# Patient Record
Sex: Male | Born: 1990 | Race: Black or African American | Hispanic: No | Marital: Single | Smoking: Never smoker
Health system: Southern US, Community
[De-identification: ages and names within clinical notes are randomized; demographics above are authoritative.]

---

## 2014-03-24 LAB — HM HIV SCREENING LAB: HM HIV Screening: NEGATIVE

## 2017-12-03 ENCOUNTER — Encounter: Payer: Self-pay | Admitting: Emergency Medicine

## 2017-12-03 ENCOUNTER — Other Ambulatory Visit: Payer: Self-pay

## 2017-12-03 ENCOUNTER — Emergency Department
Admission: EM | Admit: 2017-12-03 | Discharge: 2017-12-03 | Disposition: A | Discharge status: Home / Self Care | Payer: Self-pay | Attending: Emergency Medicine | Admitting: Emergency Medicine

## 2017-12-03 DIAGNOSIS — R197 Diarrhea, unspecified: Secondary | ICD-10-CM | POA: Insufficient documentation

## 2017-12-03 LAB — URINALYSIS, COMPLETE (UACMP) WITH MICROSCOPIC
Bacteria, UA: NONE SEEN
Bilirubin Urine: NEGATIVE
Glucose, UA: NEGATIVE mg/dL
Hgb urine dipstick: NEGATIVE
Ketones, ur: NEGATIVE mg/dL
Leukocytes, UA: NEGATIVE
Nitrite: NEGATIVE
PH: 6 (ref 5.0–8.0)
Protein, ur: NEGATIVE mg/dL
Specific Gravity, Urine: 1.018 (ref 1.005–1.030)
Squamous Epithelial / LPF: NONE SEEN (ref 0–5)

## 2017-12-03 LAB — CBC
HCT: 46.8 % (ref 40.0–52.0)
Hemoglobin: 16.5 g/dL (ref 13.0–18.0)
MCH: 32.4 pg (ref 26.0–34.0)
MCHC: 35.2 g/dL (ref 32.0–36.0)
MCV: 92.1 fL (ref 80.0–100.0)
PLATELETS: 230 10*3/uL (ref 150–440)
RBC: 5.08 MIL/uL (ref 4.40–5.90)
RDW: 12.7 % (ref 11.5–14.5)
WBC: 11 10*3/uL — ABNORMAL HIGH (ref 3.8–10.6)

## 2017-12-03 LAB — COMPREHENSIVE METABOLIC PANEL
ALK PHOS: 72 U/L (ref 38–126)
ALT: 30 U/L (ref 0–44)
AST: 24 U/L (ref 15–41)
Albumin: 4.5 g/dL (ref 3.5–5.0)
Anion gap: 7 (ref 5–15)
BILIRUBIN TOTAL: 0.6 mg/dL (ref 0.3–1.2)
BUN: 10 mg/dL (ref 6–20)
CALCIUM: 9.3 mg/dL (ref 8.9–10.3)
CO2: 27 mmol/L (ref 22–32)
CREATININE: 0.96 mg/dL (ref 0.61–1.24)
Chloride: 106 mmol/L (ref 98–111)
Glucose, Bld: 117 mg/dL — ABNORMAL HIGH (ref 70–99)
Potassium: 3.6 mmol/L (ref 3.5–5.1)
SODIUM: 140 mmol/L (ref 135–145)
TOTAL PROTEIN: 7.6 g/dL (ref 6.5–8.1)

## 2017-12-03 LAB — LIPASE, BLOOD: Lipase: 26 U/L (ref 11–51)

## 2017-12-03 MED ORDER — LOPERAMIDE HCL 2 MG PO CAPS
4.0000 mg | ORAL_CAPSULE | Freq: Once | ORAL | Status: AC
  Administered 2017-12-03: 4 mg via ORAL
  Filled 2017-12-03: qty 2

## 2017-12-03 MED ORDER — LOPERAMIDE HCL 2 MG PO TABS: 2.0000 mg | ORAL_TABLET | Freq: Four times a day (QID) | ORAL | 0 refills | Status: AC | PRN

## 2017-12-03 NOTE — ED Provider Notes (Signed)
[facility] Emergency Department Provider Note  Time seen: 12:22 PM  I have reviewed the triage vital signs and the nursing notes.   HISTORY  Chief Complaint Abdominal Pain and Diarrhea    HPI Peter Joseph is a 27 y.o. male with no past medical history presents to the emergency department for diarrhea.  According to the patient for the past 1 week he has been experiencing intermittent diarrhea he describes as up to 3 loose bowel movements per day.  Denies any nausea or vomiting.  States the only times he has discomfort is abdominal cramping just before having a bowel movement.  Denies any black or bloody stool.  Denies any fever.   History reviewed. No pertinent past medical history.  There are no active problems to display for this patient.   History reviewed. No pertinent surgical history.  Prior to Admission medications   Not on File    No Known Allergies  No family history on file.  Social History Social History   Tobacco Use  . Smoking status: Never Smoker  . Smokeless tobacco: Never Used  Substance Use Topics  . Alcohol use: Yes  . Drug use: Yes    Types: Marijuana    Review of Systems Constitutional: Negative for fever. Cardiovascular: Negative for chest pain. Respiratory: Negative for shortness of breath. Gastrointestinal: Intermittent abdominal cramping.  Negative for nausea vomiting.  Positive for loose stool x1 week. Genitourinary: Negative for urinary compaints Musculoskeletal: Negative for musculoskeletal complaints Skin: Negative for skin complaints  Neurological: Negative for headache All other ROS negative  ____________________________________________   PHYSICAL EXAM:  VITAL SIGNS: ED Triage Vitals [12/03/17 1149]  Enc Vitals Group     BP (!) 132/49     Pulse Rate 64     Resp 16     Temp 98.5 F (36.9 C)     Temp Source Oral     SpO2 100 %     Weight 150 lb (68 kg)     Height 6\' 2"  (1.88 m)     Head  Circumference      Peak Flow      Pain Score 0     Pain Loc      Pain Edu?      Excl. in GC?     Constitutional: Alert and oriented. Well appearing and in no distress. Eyes: Normal exam ENT   Head: Normocephalic and atraumatic.   Mouth/Throat: Mucous membranes are moist. Cardiovascular: Normal rate, regular rhythm. No murmur Respiratory: Normal respiratory effort without tachypnea nor retractions. Breath sounds are clear Gastrointestinal: Soft and nontender. No distention.  Musculoskeletal: Nontender with normal range of motion in all extremities.  Neurologic:  Normal speech and language. No gross focal neurologic deficits Skin:  Skin is warm, dry and intact.  Psychiatric: Mood and affect are normal.   ____________________________________________   INITIAL IMPRESSION / ASSESSMENT AND PLAN / ED COURSE  Pertinent labs & imaging results that were available during my care of the patient were reviewed by me and considered in my medical decision making (see chart for details).  Patient presents to the emergency department for intermittent diarrhea over the past 1 week.  Denies any abdominal pain nausea vomiting or fever.  States he is having 2-3 loose bowel movements per day.  Has not taken anything over-the-counter for this.  Overall the patient appears very well with a completely benign abdominal exam.  Patient blood work is largely within normal limits with a very slight leukocytosis.  Urinalysis pending.  Will adjust loperamide and continue to closely monitor.   Urinalysis is normal.  We will discharge the patient with PCP follow-up. ____________________________________________   FINAL CLINICAL IMPRESSION(S) / ED DIAGNOSES  Diarrhea    Minna Antis, MD 12/03/17 1227

## 2017-12-03 NOTE — ED Triage Notes (Signed)
Pt comes into the ED via POV c/o abdominal pain and diarrhea x 1 week.  Denies any fevers at home that he is aware of.  Denies any N/V.  Patient in NAD at this time with even and unlabored respirations.  Patient explains the abdominal pain as a generalized aching feeling.

## 2017-12-03 NOTE — ED Notes (Signed)
FIRST NURSE NOTE:  Pt reports abdominal pain and diarrhea, no distress noted at registration desk.

## 2018-06-22 ENCOUNTER — Other Ambulatory Visit: Payer: Self-pay

## 2018-06-22 ENCOUNTER — Encounter: Payer: Self-pay | Admitting: Medical Oncology

## 2018-06-22 ENCOUNTER — Emergency Department
Admission: EM | Admit: 2018-06-22 | Discharge: 2018-06-22 | Disposition: A | Discharge status: Home / Self Care | Payer: Self-pay | Attending: Emergency Medicine | Admitting: Emergency Medicine

## 2018-06-22 DIAGNOSIS — B001 Herpesviral vesicular dermatitis: Secondary | ICD-10-CM | POA: Insufficient documentation

## 2018-06-22 NOTE — ED Provider Notes (Signed)
[facility] Emergency Department Provider Note  ____________________________________________   First MD Initiated Contact with Patient 06/22/18 1658     (approximate)  I have reviewed the triage vital signs and the nursing notes.   HISTORY  Chief Complaint Acne    HPI Peter Joseph is a 28 y.o. male presents emergency department with a bump that is been on his lip for 2 days.  States it burst in the shower.  He is on sure of what it is and is concerned.  He has had no fever or chills.  No cough or congestion.  He states "all this crazy stuff going on I have to know what it is and I need a test.  "    History reviewed. No pertinent past medical history.  There are no active problems to display for this patient.   History reviewed. No pertinent surgical history.  Prior to Admission medications   Medication Sig Start Date End Date Taking? Authorizing Provider  loperamide (IMODIUM A-D) 2 MG tablet Take 1 tablet (2 mg total) by mouth 4 (four) times daily as needed for diarrhea or loose stools. 12/03/17   Minna Antis, MD    Allergies Patient has no known allergies.  No family history on file.  Social History Social History   Tobacco Use  . Smoking status: Never Smoker  . Smokeless tobacco: Never Used  Substance Use Topics  . Alcohol use: Yes  . Drug use: Yes    Types: Marijuana    Review of Systems  Constitutional: No fever/chills Eyes: No visual changes. ENT: No sore throat.  Positive cold sore Respiratory: Denies cough Genitourinary: Negative for dysuria. Musculoskeletal: Negative for back pain. Skin: Negative for rash.    ____________________________________________   PHYSICAL EXAM:  VITAL SIGNS: ED Triage Vitals  Enc Vitals Group     BP 06/22/18 1657 (!) 144/67     Pulse Rate 06/22/18 1657 (!) 104     Resp 06/22/18 1657 18     Temp 06/22/18 1657 98.9 F (37.2 C)     Temp Source 06/22/18 1657 Oral     SpO2  06/22/18 1657 100 %     Weight 06/22/18 1655 149 lb 14.6 oz (68 kg)     Height 06/22/18 1657 6\' 2"  (1.88 m)     Head Circumference --      Peak Flow --      Pain Score 06/22/18 1655 0     Pain Loc --      Pain Edu? --      Excl. in GC? --     Constitutional: Alert and oriented. Well appearing and in no acute distress. Eyes: Conjunctivae are normal.  Head: Atraumatic. Nose: No congestion/rhinnorhea. Mouth/Throat: Mucous membranes are moist.  Positive for a dried lesion on the left side of the upper lip.  No pus or drainage is noted.  No vesicles noted at this time.  No redness. Neck:  supple no lymphadenopathy noted Cardiovascular: Normal rate, regular rhythm.  Respiratory: Normal respiratory effort.  No retractions GU: deferred Musculoskeletal: FROM all extremities, warm and well perfused Neurologic:  Normal speech and language.  Skin:  Skin is warm, dry and intact. No rash noted. Psychiatric: Mood and affect are normal. Speech and behavior are normal.  ____________________________________________   LABS (all labs ordered are listed, but only abnormal results are displayed)  Labs Reviewed - No data to display ____________________________________________   ____________________________________________  RADIOLOGY    ____________________________________________   PROCEDURES  Procedure(s) performed: No  Procedures    ____________________________________________   INITIAL IMPRESSION / ASSESSMENT AND PLAN / ED COURSE  Pertinent labs & imaging results that were available during my care of the patient were reviewed by me and considered in my medical decision making (see chart for details).   Patient is a 28 year old male presents emergency department complaining of a bump to the left upper lip.  On physical exam there is a dried lesion noted on the upper lip.  No vesicles noted.  No pus is noted.  No redness or swelling is noted.  Explained to the patient there is  no test for this.  There is no concern for this.  This is already a healing either cold sore or ingrown hair.  He can follow-up with his regular doctor if any concerns.  He was discharged stable condition.     As part of my medical decision making, I reviewed the following data within the electronic MEDICAL RECORD NUMBER Nursing notes reviewed and incorporated, Old chart reviewed, Notes from prior ED visits and [place] Controlled Substance Database  ____________________________________________   FINAL CLINICAL IMPRESSION(S) / ED DIAGNOSES  Final diagnoses:  Herpes labialis      NEW MEDICATIONS STARTED DURING THIS VISIT:  New Prescriptions   No medications on file     Note:  This document was prepared using Dragon voice recognition software and may include unintentional dictation errors.    Faythe Ghee, PA-C 06/22/18 1712    Minna Antis, MD 06/23/18 2122

## 2018-06-22 NOTE — ED Triage Notes (Signed)
Pt reports "bump" on lip x 2 days. Area popped already. Nothing observed by this RN on pt.

## 2018-06-22 NOTE — Discharge Instructions (Addendum)
Follow-up with your regular doctor or the [facility] department as needed.  There is no concern for the bump on your lip.  He can apply some Vaseline or Neosporin to the area.  If they reappear and are painful then you could return to the emergency department at that time.

## 2019-07-07 ENCOUNTER — Other Ambulatory Visit: Payer: Self-pay

## 2019-07-07 ENCOUNTER — Ambulatory Visit: Payer: Self-pay | Admitting: Physician Assistant

## 2019-07-07 ENCOUNTER — Encounter: Payer: Self-pay | Admitting: Physician Assistant

## 2019-07-07 DIAGNOSIS — Z113 Encounter for screening for infections with a predominantly sexual mode of transmission: Secondary | ICD-10-CM

## 2019-07-07 LAB — GRAM STAIN

## 2019-07-07 NOTE — Progress Notes (Signed)
   [facility] Department STI clinic/screening visit  Subjective:  Peter Joseph is a 29 y.o. male being seen today for an STI screening visit. The patient reports they do not have symptoms.    Patient has the following medical conditions:  There are no problems to display for this patient.    Chief Complaint  Patient presents with  . SEXUALLY TRANSMITTED DISEASE    HPI  Patient reports that he is not having any symptoms but would like a screening today.  Denies chronic conditions, regular medicines and history of any surgeries.   See flowsheet for further details and programmatic requirements.    The following portions of the patient's history were reviewed and updated as appropriate: allergies, current medications, past medical history, past social history, past surgical history and problem list.  Objective:  There were no vitals filed for this visit.  Physical Exam Constitutional:      General: He is not in acute distress.    Appearance: Normal appearance. He is normal weight.  HENT:     Head: Normocephalic and atraumatic.     Mouth/Throat:     Mouth: Mucous membranes are moist.     Pharynx: Oropharynx is clear. No oropharyngeal exudate or posterior oropharyngeal erythema.  Eyes:     Conjunctiva/sclera: Conjunctivae normal.  Pulmonary:     Effort: Pulmonary effort is normal.  Abdominal:     Palpations: Abdomen is soft. There is no mass.     Tenderness: There is no abdominal tenderness. There is no guarding or rebound.  Genitourinary:    Penis: Normal.      Testes: Normal.     Comments: Pubic area without nits, lice, edema, erythema, lesions and inguinal adenopathy. Penis circumcised, without rash, lesions and inguinal adenopathy. Musculoskeletal:     Cervical back: Neck supple. No tenderness.  Skin:    General: Skin is warm and dry.     Findings: No bruising, erythema, lesion or rash.  Neurological:     Mental Status: He is alert and oriented to  person, place, and time.  Psychiatric:        Mood and Affect: Mood normal.        Behavior: Behavior normal.        Thought Content: Thought content normal.        Judgment: Judgment normal.       Assessment and Plan:  Peter Joseph is a 29 y.o. male presenting to the [facility] Department for STI screening  1. Screening for STD (sexually transmitted disease) Patient into clinic without symptoms.  Declines blood work today. Rec condoms with all sex. Await test results.  Counseled that RN will call if needs to RTC for treatment once results are back. - Gram stain - Gonococcus culture - Gonococcus culture     No follow-ups on file.  No future appointments.  Matt Holmes, PA

## 2019-07-12 LAB — GONOCOCCUS CULTURE

## 2019-09-15 ENCOUNTER — Ambulatory Visit: Payer: Self-pay | Admitting: Physician Assistant

## 2019-09-15 ENCOUNTER — Other Ambulatory Visit: Payer: Self-pay

## 2019-09-15 DIAGNOSIS — Z113 Encounter for screening for infections with a predominantly sexual mode of transmission: Secondary | ICD-10-CM

## 2019-09-15 LAB — GRAM STAIN

## 2019-09-15 NOTE — Progress Notes (Signed)
Here today for STD screening. Declines bloodwork. Lydell Moga, RN ? ?

## 2019-09-16 NOTE — Progress Notes (Signed)
   [facility] Department STI clinic/screening visit  Subjective:  Peter Joseph is a 29 y.o. male being seen today for an STI screening visit. The patient reports they do not have symptoms.    Patient has the following medical conditions:  There are no problems to display for this patient.    Chief Complaint  Patient presents with  . SEXUALLY TRANSMITTED DISEASE    HPI  Patient reports that he does not have any symptoms but would like a screening today.  Reports last HIV test was 06/2019.  Denies chronic conditions, regular medicines and surgeries.   See flowsheet for further details and programmatic requirements.    The following portions of the patient's history were reviewed and updated as appropriate: allergies, current medications, past medical history, past social history, past surgical history and problem list.  Objective:  There were no vitals filed for this visit.  Physical Exam Constitutional:      General: He is not in acute distress.    Appearance: Normal appearance.  HENT:     Head: Normocephalic and atraumatic.     Comments: No nits, lice, or hair loss. No cervical, supraclavicular or axillary adenopathy.    Mouth/Throat:     Mouth: Mucous membranes are moist.     Pharynx: Oropharynx is clear. No oropharyngeal exudate or posterior oropharyngeal erythema.  Eyes:     Conjunctiva/sclera: Conjunctivae normal.  Pulmonary:     Effort: Pulmonary effort is normal.  Abdominal:     Palpations: Abdomen is soft. There is no mass.     Tenderness: There is no abdominal tenderness. There is no guarding or rebound.  Genitourinary:    Penis: Normal.      Testes: Normal.     Comments: Pubic area without nits, lice, edema, erythema, lesions and inguinal adenopathy. Penis circumcised, without rash, lesions and discharge at meatus. Musculoskeletal:     Cervical back: Neck supple. No tenderness.  Skin:    General: Skin is warm and dry.     Findings: No  bruising, erythema, lesion or rash.  Neurological:     Mental Status: He is alert and oriented to person, place, and time.  Psychiatric:        Mood and Affect: Mood normal.        Behavior: Behavior normal.        Thought Content: Thought content normal.        Judgment: Judgment normal.       Assessment and Plan:  Peter Joseph is a 29 y.o. male presenting to the [facility] Department for STI screening  1. Screening for STD (sexually transmitted disease) Patient into clinic without symptoms.  Declines blood work today. Reviewed with patient exam and Gram stain findings and no treatment is indicated today. Rec condoms with all sex. Await test results.  Counseled that RN will call if needs to RTC for treatment once results are back.  - Gram stain - Gonococcus culture - Gonococcus culture     No follow-ups on file.  No future appointments.  Matt Holmes, PA

## 2019-09-20 LAB — GONOCOCCUS CULTURE

## 2020-06-27 ENCOUNTER — Emergency Department
Admission: EM | Admit: 2020-06-27 | Discharge: 2020-06-27 | Disposition: A | Discharge status: Home / Self Care | Payer: No Typology Code available for payment source | Attending: Emergency Medicine | Admitting: Emergency Medicine

## 2020-06-27 ENCOUNTER — Encounter: Payer: Self-pay | Admitting: Emergency Medicine

## 2020-06-27 ENCOUNTER — Other Ambulatory Visit: Payer: Self-pay

## 2020-06-27 ENCOUNTER — Emergency Department: Payer: No Typology Code available for payment source

## 2020-06-27 DIAGNOSIS — M25512 Pain in left shoulder: Secondary | ICD-10-CM | POA: Diagnosis not present

## 2020-06-27 DIAGNOSIS — S62012A Displaced fracture of distal pole of navicular [scaphoid] bone of left wrist, initial encounter for closed fracture: Secondary | ICD-10-CM | POA: Diagnosis not present

## 2020-06-27 DIAGNOSIS — M79605 Pain in left leg: Secondary | ICD-10-CM | POA: Insufficient documentation

## 2020-06-27 DIAGNOSIS — S6992XA Unspecified injury of left wrist, hand and finger(s), initial encounter: Secondary | ICD-10-CM | POA: Diagnosis present

## 2020-06-27 DIAGNOSIS — Y92410 Unspecified street and highway as the place of occurrence of the external cause: Secondary | ICD-10-CM | POA: Diagnosis not present

## 2020-06-27 DIAGNOSIS — M25532 Pain in left wrist: Secondary | ICD-10-CM

## 2020-06-27 MED ORDER — METHOCARBAMOL 750 MG PO TABS: 750.0000 mg | ORAL_TABLET | Freq: Four times a day (QID) | ORAL | 0 refills | Status: AC | PRN

## 2020-06-27 MED ORDER — METHOCARBAMOL 500 MG PO TABS
750.0000 mg | ORAL_TABLET | Freq: Once | ORAL | Status: AC
  Administered 2020-06-27: 750 mg via ORAL
  Filled 2020-06-27: qty 2

## 2020-06-27 MED ORDER — MELOXICAM 7.5 MG PO TABS
15.0000 mg | ORAL_TABLET | Freq: Once | ORAL | Status: AC
  Administered 2020-06-27: 15 mg via ORAL
  Filled 2020-06-27: qty 2

## 2020-06-27 MED ORDER — MELOXICAM 15 MG PO TABS: 15.0000 mg | ORAL_TABLET | Freq: Every day | ORAL | 0 refills | Status: AC

## 2020-06-27 NOTE — Discharge Instructions (Addendum)
Please use the Mobic as prescribed once daily with food.  You may also use the Robaxin as prescribed, up to 4 times daily.  Please do not take this medication and drive or operate heavy machinery.  You may also combine these with Tylenol, up to 1000 mg 4 times daily.  Please keep the splint in place until follow-up with orthopedics.
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
## 2021-02-24 ENCOUNTER — Ambulatory Visit: Payer: Self-pay

## 2021-10-20 ENCOUNTER — Ambulatory Visit: Payer: Self-pay | Admitting: Nurse Practitioner

## 2021-10-20 ENCOUNTER — Encounter: Payer: Self-pay | Admitting: Nurse Practitioner

## 2021-10-20 DIAGNOSIS — Z113 Encounter for screening for infections with a predominantly sexual mode of transmission: Secondary | ICD-10-CM

## 2021-10-20 NOTE — Progress Notes (Signed)
[facility] Department STI clinic/screening visit  Subjective:  Peter Joseph is a 31 y.o. male being seen today for an STI screening visit. The patient reports they do not have symptoms.    Patient has the following medical conditions:  There are no problems to display for this patient.    Chief Complaint  Patient presents with   SEXUALLY TRANSMITTED DISEASE    Screening     HPI  Patient reports to clinic today for STD screening.  Patient reports being asymptomatic.    Does the patient or their partner desires a pregnancy in the next year? No  Screening for MPX risk: Does the patient have an unexplained rash? No Is the patient MSM? No Does the patient endorse multiple sex partners or anonymous sex partners? No Did the patient have close or sexual contact with a person diagnosed with MPX? No Has the patient traveled outside the [place] where MPX is endemic? No Is there a high clinical suspicion for MPX-- evidenced by one of the following No  -Unlikely to be chickenpox  -Lymphadenopathy  -Rash that present in same phase of evolution on any given body part   See flowsheet for further details and programmatic requirements.   Immunization History  Administered Date(s) Administered   HPV Quadrivalent 12/24/2008, 02/23/2009, 10/20/2009   Hepatitis A, Adult 11/09/2005, 01/10/2007   Hepatitis B, adult 04/04/1992, 01/10/1993, 05/11/1993   MMR 02/14/1994, 11/13/1996   Meningococcal Conjugate 11/09/2005, 10/20/2009   Tdap 08/30/2004     The following portions of the patient's history were reviewed and updated as appropriate: allergies, current medications, past medical history, past social history, past surgical history and problem list.  Objective:  There were no vitals filed for this visit.  Physical Exam Constitutional:      Appearance: Normal appearance.  HENT:     Head: Normocephalic. No abrasion, masses or laceration. Hair is normal.     Right Ear: External  ear normal.     Left Ear: External ear normal.     Nose: Nose normal.     Mouth/Throat:     Mouth: No oral lesions.     Dentition: No dental caries.     Pharynx: No pharyngeal swelling, oropharyngeal exudate, posterior oropharyngeal erythema or uvula swelling.     Tonsils: No tonsillar exudate or tonsillar abscesses.  Eyes:     General: Lids are normal.        Right eye: No discharge.        Left eye: No discharge.     Conjunctiva/sclera: Conjunctivae normal.     Right eye: No exudate.    Left eye: No exudate. Abdominal:     General: Abdomen is flat.     Palpations: Abdomen is soft.     Tenderness: There is no abdominal tenderness. There is no rebound.  Genitourinary:    Pubic Area: No rash or pubic lice.      Penis: Normal and circumcised. No erythema or discharge.      Testes: Normal.        Right: Mass or tenderness not present.        Left: Mass or tenderness not present.     Rectum: Normal.     Comments: Discharge amount: None Color: None  Musculoskeletal:     Cervical back: Full passive range of motion without pain, normal range of motion and neck supple.  Lymphadenopathy:     Cervical: No cervical adenopathy.     Right cervical: No superficial, deep or  posterior cervical adenopathy.    Left cervical: No superficial, deep or posterior cervical adenopathy.     Upper Body:     Right upper body: No supraclavicular, axillary or epitrochlear adenopathy.     Left upper body: No supraclavicular, axillary or epitrochlear adenopathy.     Lower Body: No right inguinal adenopathy. No left inguinal adenopathy.  Skin:    General: Skin is warm and dry.     Findings: No lesion or rash.  Neurological:     Mental Status: He is alert and oriented to person, place, and time.  Psychiatric:        Attention and Perception: Attention normal.        Mood and Affect: Mood normal.        Speech: Speech normal.        Behavior: Behavior normal. Behavior is cooperative.        Assessment and Plan:  Peter Joseph is a 31 y.o. male presenting to the [facility] Department for STI screening  1. Screening examination for venereal disease -31 year old male in clinic today for STD screening. -Patient does not have STI symptoms Patient accepted all screenings including  oral GC, urine CT/GC and declines bloodwork for HIV/RPR.  Patient meets criteria for HepB screening? Yes. Ordered? No - refused Patient meets criteria for HepC screening? Yes. Ordered? No - refused Recommended condom use with all sex Discussed importance of condom use for STI prevent   Discussed time line for [facility] results and that patient will be called with positive results and encouraged patient to call if he had not heard in 2 weeks Recommended returning for continued or worsening symptoms.    - Gonococcus culture - Chlamydia/GC NAA, Confirmation  Total time spent: 20 minutes    Return if symptoms worsen or fail to improve.   Gregary Cromer, FNP

## 2021-10-25 LAB — CHLAMYDIA/GC NAA, CONFIRMATION
Chlamydia trachomatis, NAA: NEGATIVE
Neisseria gonorrhoeae, NAA: NEGATIVE

## 2021-10-26 LAB — GONOCOCCUS CULTURE

## 2022-03-01 ENCOUNTER — Ambulatory Visit: Payer: Self-pay
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
